# Patient Record
Sex: Female | Born: 1970 | Race: White | Hispanic: Yes | Marital: Married | Smoking: Current every day smoker
Health system: Southern US, Community
[De-identification: ages and names within clinical notes are randomized; demographics above are authoritative.]

---

## 2019-03-25 ENCOUNTER — Emergency Department
Admission: EM | Admit: 2019-03-25 | Discharge: 2019-03-25 | Disposition: A | Discharge status: Home / Self Care | Payer: Self-pay | Attending: Emergency Medicine | Admitting: Emergency Medicine

## 2019-03-25 ENCOUNTER — Other Ambulatory Visit: Payer: Self-pay

## 2019-03-25 ENCOUNTER — Encounter: Payer: Self-pay | Admitting: Emergency Medicine

## 2019-03-25 ENCOUNTER — Emergency Department: Payer: Self-pay

## 2019-03-25 DIAGNOSIS — E876 Hypokalemia: Secondary | ICD-10-CM | POA: Insufficient documentation

## 2019-03-25 DIAGNOSIS — R1013 Epigastric pain: Secondary | ICD-10-CM | POA: Insufficient documentation

## 2019-03-25 DIAGNOSIS — R0789 Other chest pain: Secondary | ICD-10-CM | POA: Insufficient documentation

## 2019-03-25 DIAGNOSIS — R112 Nausea with vomiting, unspecified: Secondary | ICD-10-CM | POA: Insufficient documentation

## 2019-03-25 DIAGNOSIS — R197 Diarrhea, unspecified: Secondary | ICD-10-CM | POA: Insufficient documentation

## 2019-03-25 LAB — CBC
HCT: 42 % (ref 36.0–46.0)
Hemoglobin: 13.2 g/dL (ref 12.0–15.0)
MCH: 24.4 pg — ABNORMAL LOW (ref 26.0–34.0)
MCHC: 31.4 g/dL (ref 30.0–36.0)
MCV: 77.6 fL — ABNORMAL LOW (ref 80.0–100.0)
Platelets: 295 10*3/uL (ref 150–400)
RBC: 5.41 MIL/uL — ABNORMAL HIGH (ref 3.87–5.11)
RDW: 16.9 % — ABNORMAL HIGH (ref 11.5–15.5)
WBC: 12.6 10*3/uL — ABNORMAL HIGH (ref 4.0–10.5)
nRBC: 0 % (ref 0.0–0.2)

## 2019-03-25 LAB — TROPONIN I (HIGH SENSITIVITY): Troponin I (High Sensitivity): <2 ng/L (ref <18)

## 2019-03-25 LAB — COMPREHENSIVE METABOLIC PANEL
ALT: 32 U/L (ref 0–44)
AST: 20 U/L (ref 15–41)
Albumin: 4.2 g/dL (ref 3.5–5.0)
Alkaline Phosphatase: 65 U/L (ref 38–126)
Anion gap: 11 (ref 5–15)
BUN: 20 mg/dL (ref 6–20)
CO2: 25 mmol/L (ref 22–32)
Calcium: 8.7 mg/dL — ABNORMAL LOW (ref 8.9–10.3)
Chloride: 97 mmol/L — ABNORMAL LOW (ref 98–111)
Creatinine, Ser: 0.62 mg/dL (ref 0.44–1.00)
GFR calc Af Amer: >60 mL/min (ref >60)
GFR calc non Af Amer: >60 mL/min (ref >60)
Glucose, Bld: 119 mg/dL — ABNORMAL HIGH (ref 70–99)
Potassium: 3 mmol/L — ABNORMAL LOW (ref 3.5–5.1)
Sodium: 133 mmol/L — ABNORMAL LOW (ref 135–145)
Total Bilirubin: 1 mg/dL (ref 0.3–1.2)
Total Protein: 7.4 g/dL (ref 6.5–8.1)

## 2019-03-25 LAB — URINALYSIS, COMPLETE (UACMP) WITH MICROSCOPIC
Bilirubin Urine: NEGATIVE
Glucose, UA: NEGATIVE mg/dL
Hgb urine dipstick: NEGATIVE
Ketones, ur: NEGATIVE mg/dL
Leukocytes,Ua: NEGATIVE
Nitrite: NEGATIVE
Protein, ur: NEGATIVE mg/dL
Specific Gravity, Urine: 1.01 (ref 1.005–1.030)
pH: 6 (ref 5.0–8.0)

## 2019-03-25 LAB — POCT PREGNANCY, URINE
Preg Test, Ur: NEGATIVE
Preg Test, Ur: NEGATIVE

## 2019-03-25 LAB — LIPASE, BLOOD: Lipase: 29 U/L (ref 11–51)

## 2019-03-25 IMAGING — CT CT ABD-PELV W/ CM
2 of 5 series · 16 of 46 positions shown, 18 images · IV contrast (APPLIED)
Comparison: None.

CLINICAL DATA: Chest and abdomen pain for 4 days. Vomiting.

EXAM:
CT ABDOMEN AND PELVIS WITH CONTRAST
TECHNIQUE: Multidetector CT imaging of the abdomen and pelvis was performed
using the standard protocol following bolus administration of
intravenous contrast.
CONTRAST:  75mL OMNIPAQUE IOHEXOL 300 MG/ML  SOLN

[Series 2: axial st · axial · 0.69mm/px · z∈[-799,-424]mm · 13 of 85 slices shown, 15 images]
[im 5/85  soft-tissue]
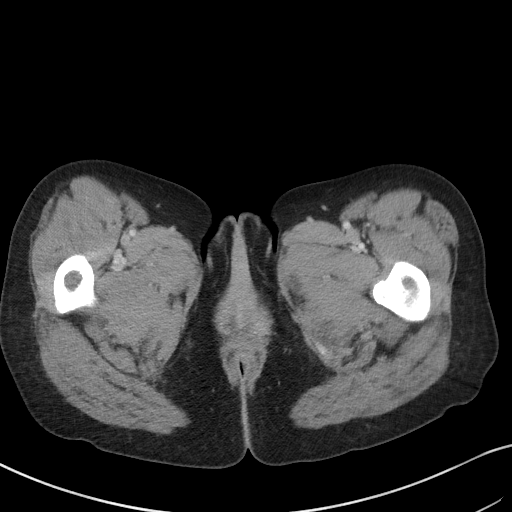
[im 5/85  bone]
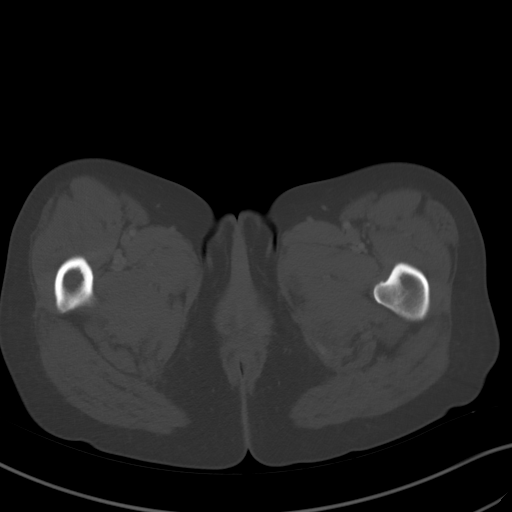
[im 14/85  soft-tissue]
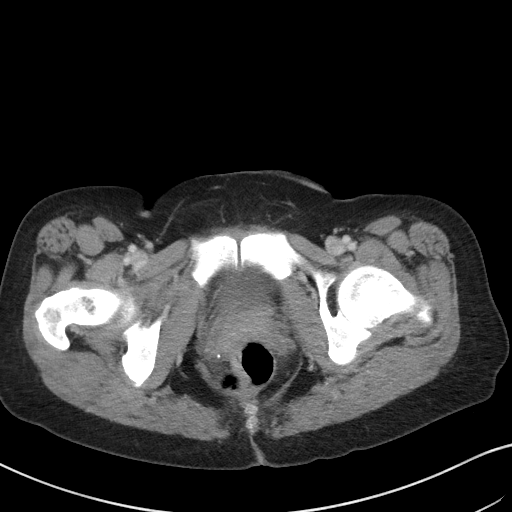
[im 18/85  soft-tissue]
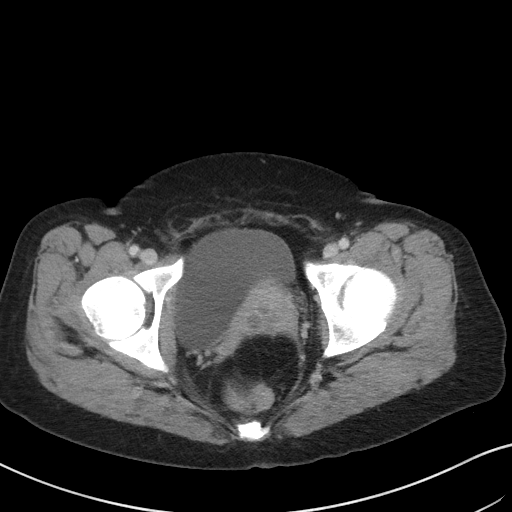
[im 23/85  soft-tissue]
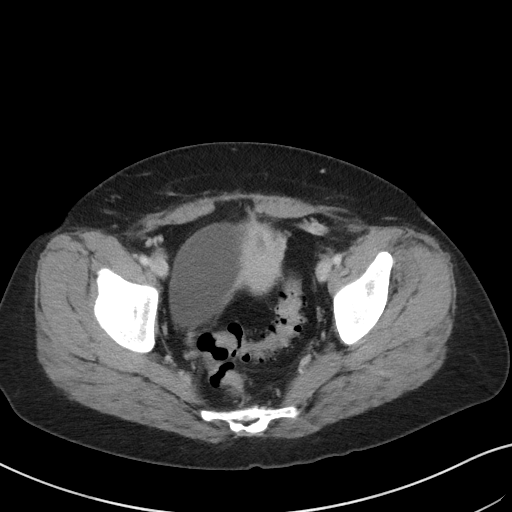
[im 31/85  soft-tissue]
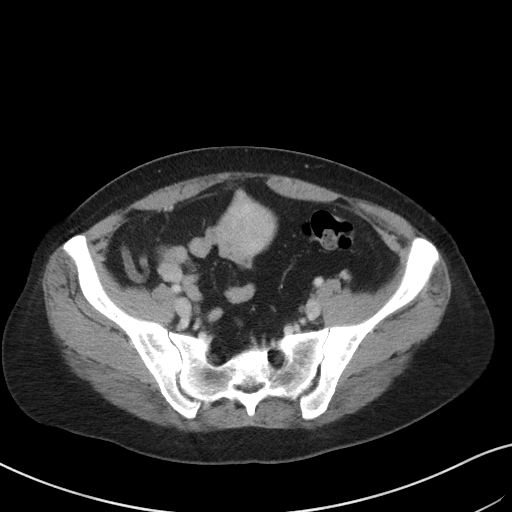
[im 36/85  soft-tissue]
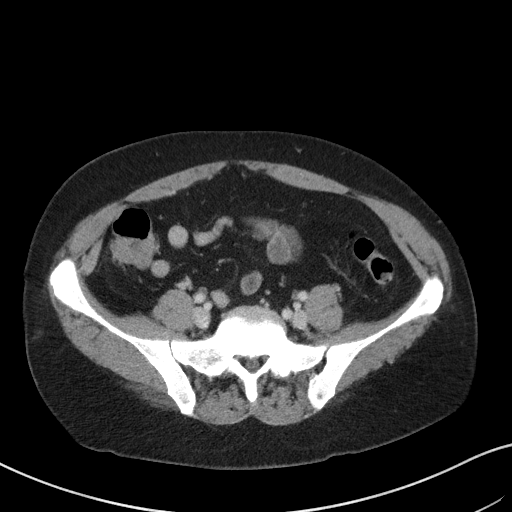
[im 45/85  soft-tissue]
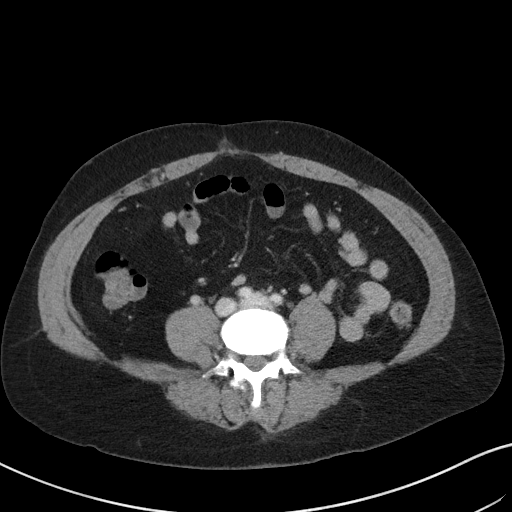
[im 49/85  soft-tissue]
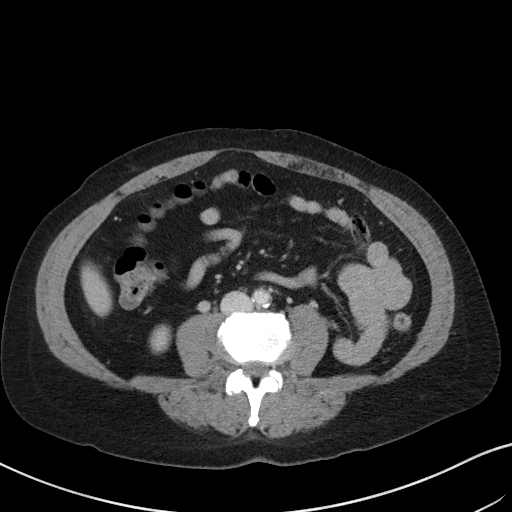
[im 54/85  soft-tissue]
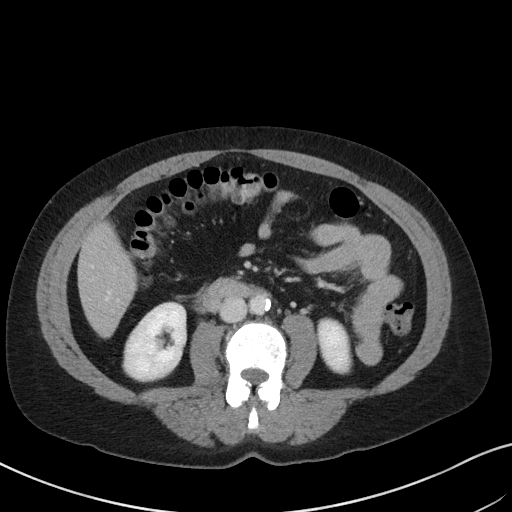
[im 54/85  bone]
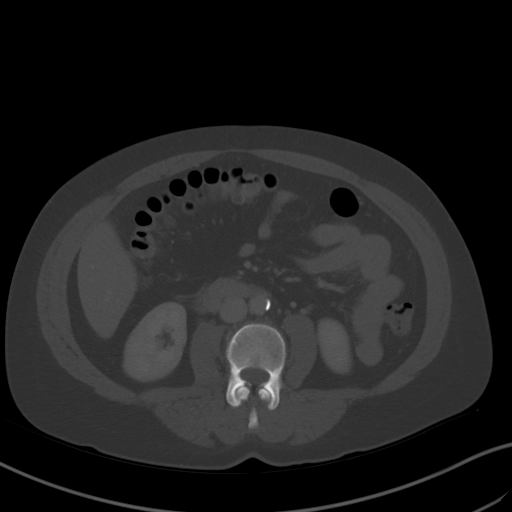
[im 62/85  soft-tissue]
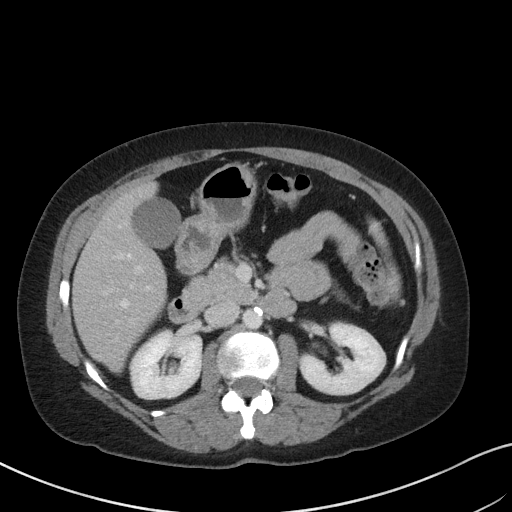
[im 67/85  soft-tissue]
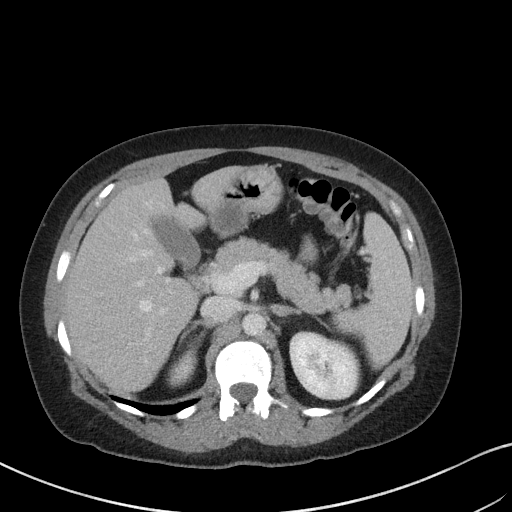
[im 71/85  soft-tissue]
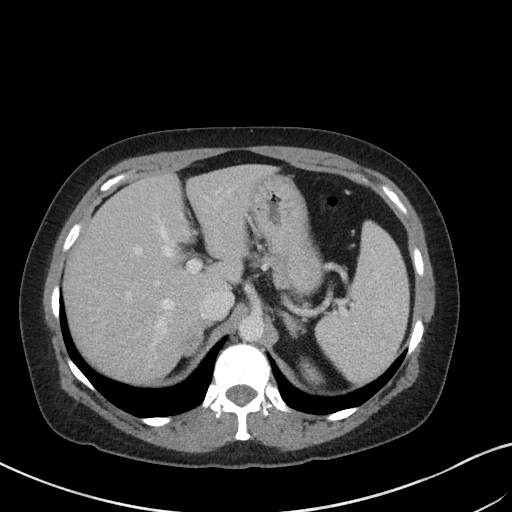
[im 80/85  soft-tissue]
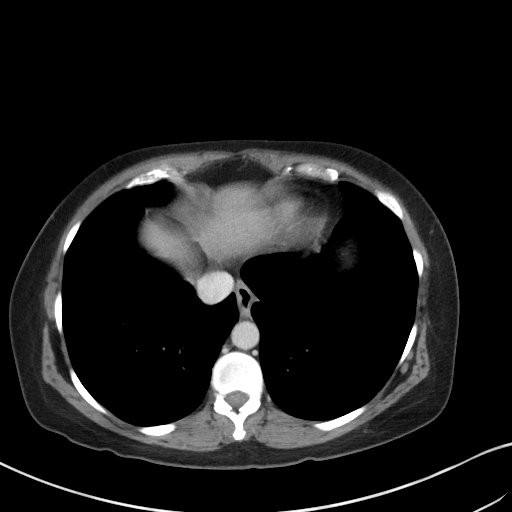

[Series 5: coronal st · coronal · 0.64mm/px · 3 of 86 slices shown]
[im 29/86  soft-tissue]
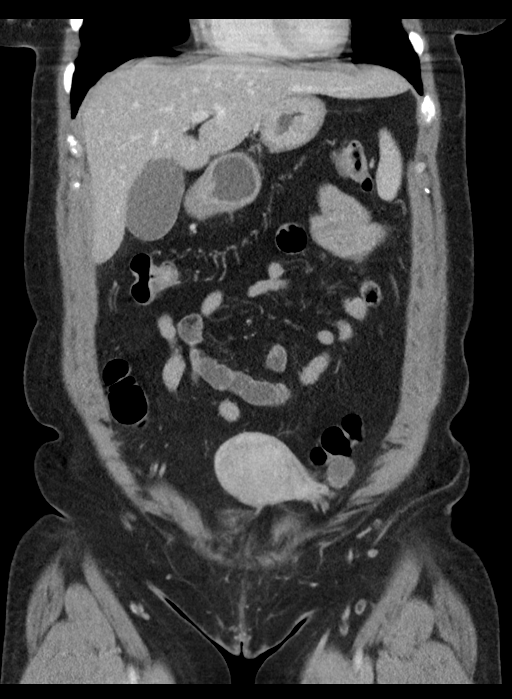
[im 38/86  soft-tissue]
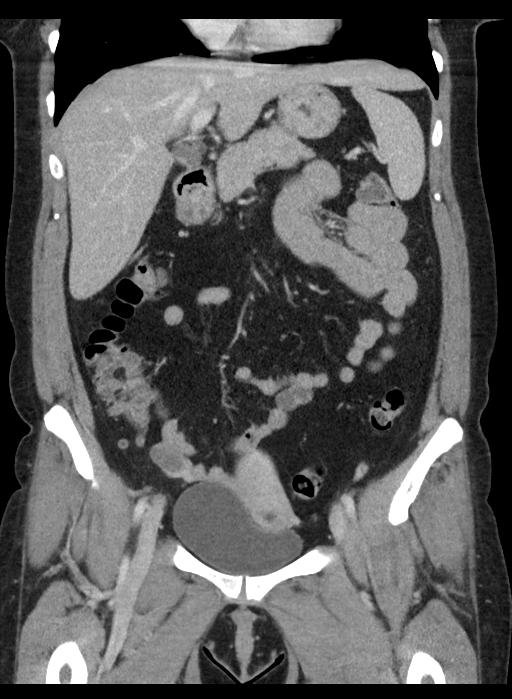
[im 48/86  soft-tissue]
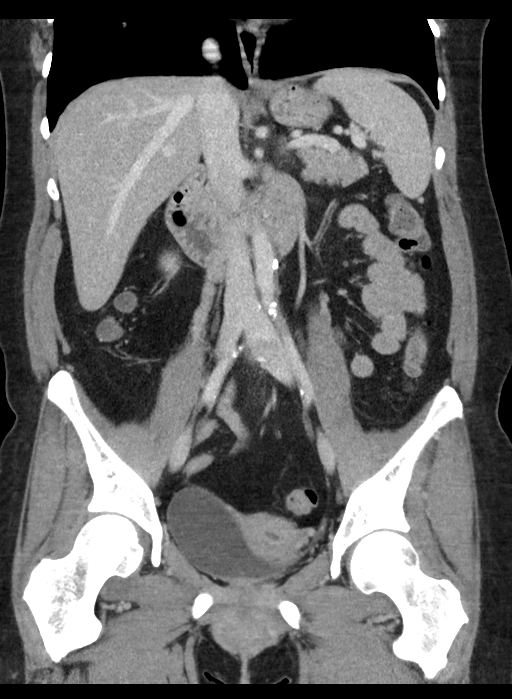

[16 of 46 positions shown; findings below may reference images not displayed]

FINDINGS: Lower chest: No acute abnormality.

Hepatobiliary: No focal liver abnormality is seen. No gallstones,
gallbladder wall thickening, or biliary dilatation.

Pancreas: Unremarkable. No pancreatic ductal dilatation or
surrounding inflammatory changes.

Spleen: Normal in size without focal abnormality.

Adrenals/Urinary Tract: There is a 2.5 cm slight low-density mass in
the right adrenal gland. The left adrenal gland is normal. The
bilateral kidneys are normal. There is no hydronephrosis
bilaterally. The bladder is normal.

Stomach/Bowel: Stomach is within normal limits. Appendix appears
normal. No evidence of bowel wall thickening, distention, or
inflammatory changes.

Vascular/Lymphatic: Aortic atherosclerosis. No enlarged abdominal or
pelvic lymph nodes.

Reproductive: Low-density lesions are identified in the uterus
probably uterine fibroids. The bilateral adnexa are normal.

Other: None.

Musculoskeletal: Minimal degenerative joint changes of spine are
identified.
IMPRESSION: 1. No acute abnormality identified in the abdomen and pelvis.
2. 2.5 cm slight low-density mass in the right adrenal gland.
Consider further evaluation with adrenal mass protocol CT on
outpatient basis.

## 2019-03-25 MED ORDER — IOHEXOL 300 MG/ML  SOLN
75.0000 mL | Freq: Once | INTRAMUSCULAR | Status: AC | PRN
  Administered 2019-03-25: 75 mL via INTRAVENOUS
  Filled 2019-03-25: qty 75

## 2019-03-25 MED ORDER — SODIUM CHLORIDE 0.9% FLUSH: 3.0000 mL | Freq: Once | INTRAVENOUS | Status: DC

## 2019-03-25 MED ORDER — MORPHINE SULFATE (PF) 4 MG/ML IV SOLN
4.0000 mg | Freq: Once | INTRAVENOUS | Status: AC
  Administered 2019-03-25: 4 mg via INTRAVENOUS
  Filled 2019-03-25: qty 1

## 2019-03-25 MED ORDER — ONDANSETRON HCL 4 MG/2ML IJ SOLN
4.0000 mg | Freq: Once | INTRAMUSCULAR | Status: AC
  Administered 2019-03-25: 4 mg via INTRAVENOUS
  Filled 2019-03-25: qty 2

## 2019-03-25 MED ORDER — ONDANSETRON 4 MG PO TBDP: 4.0000 mg | ORAL_TABLET | Freq: Three times a day (TID) | ORAL | 0 refills | Status: DC | PRN

## 2019-03-25 MED ORDER — POTASSIUM CHLORIDE CRYS ER 20 MEQ PO TBCR
40.0000 meq | EXTENDED_RELEASE_TABLET | Freq: Once | ORAL | Status: AC
  Administered 2019-03-25: 40 meq via ORAL
  Filled 2019-03-25: qty 2

## 2019-03-25 MED ORDER — LACTATED RINGERS IV BOLUS
1000.0000 mL | Freq: Once | INTRAVENOUS | Status: AC
  Administered 2019-03-25: 1000 mL via INTRAVENOUS

## 2019-03-25 NOTE — ED Provider Notes (Signed)
[facility] Emergency Department Provider Note   ____________________________________________   First MD Initiated Contact with Patient 03/25/19 1326     (approximate)  I have reviewed the triage vital signs and the nursing notes.   HISTORY  Chief Complaint Abdominal Pain and Emesis    HPI Dana Jordan is a 49 y.o. female with no significant past medical history who presents to the ED complaining of abdominal pain and vomiting.  Patient reports that she first developed nausea and vomiting 4 days ago with an inability to tolerate any p.o.  Since then, she has had onset of epigastric and central chest pain, which she describes as constant and sharp, not exacerbated or alleviated by anything.  Her symptoms have been associated with diarrhea, but she denies any dysuria or hematuria.  She has not had any fevers, cough, or shortness of breath, and she denies any sick contacts.  She denies similar episodes in the past, states her abdominal surgical history only includes cesarean sections.  She states her LMP was 3 weeks ago.        History reviewed. No pertinent past medical history.  There are no problems to display for this patient.   History reviewed. No pertinent surgical history.  Prior to Admission medications   Medication Sig Start Date End Date Taking? Authorizing Provider  ondansetron (ZOFRAN ODT) 4 MG disintegrating tablet Take 1 tablet (4 mg total) by mouth every 8 (eight) hours as needed for nausea or vomiting. 03/25/19   Blake Divine, MD    Allergies Patient has no known allergies.  No family history on file.  Social History Social History   Tobacco Use  . Smoking status: Not on file  Substance Use Topics  . Alcohol use: Not on file  . Drug use: Not on file    Review of Systems  Constitutional: No fever/chills Eyes: No visual changes. ENT: No sore throat. Cardiovascular: Positive for chest pain. Respiratory: Denies  shortness of breath. Gastrointestinal: Positive for abdominal pain.  Positive for nausea and vomiting.  Positive for diarrhea.  No constipation. Genitourinary: Negative for dysuria. Musculoskeletal: Negative for back pain. Skin: Negative for rash. Neurological: Negative for headaches, focal weakness or numbness.  ____________________________________________   PHYSICAL EXAM:  VITAL SIGNS: ED Triage Vitals [03/25/19 1130]  Enc Vitals Group     BP (!) 138/93     Pulse Rate 96     Resp 20     Temp 98.6 F (37 C)     Temp Source Oral     SpO2 99 %     Weight 125 lb (56.7 kg)     Height 4\' 11"  (1.499 m)     Head Circumference      Peak Flow      Pain Score 10     Pain Loc      Pain Edu?      Excl. in [place]?     Constitutional: Alert and oriented. Eyes: Conjunctivae are normal. Head: Atraumatic. Nose: No congestion/rhinnorhea. Mouth/Throat: Mucous membranes are moist. Neck: Normal ROM Cardiovascular: Normal rate, regular rhythm. Grossly normal heart sounds. Respiratory: Normal respiratory effort.  No retractions. Lungs CTAB.  No chest wall tenderness. Gastrointestinal: Soft and tender to palpation in the epigastrium with no rebound or guarding. No distention. Genitourinary: deferred Musculoskeletal: No lower extremity tenderness nor edema. Neurologic:  Normal speech and language. No gross focal neurologic deficits are appreciated. Skin:  Skin is warm, dry and intact. No rash noted. Psychiatric: Mood  and affect are normal. Speech and behavior are normal.  ____________________________________________   LABS (all labs ordered are listed, but only abnormal results are displayed)  Labs Reviewed  COMPREHENSIVE METABOLIC PANEL - Abnormal; Notable for the following components:      Result Value   Sodium 133 (*)    Potassium 3.0 (*)    Chloride 97 (*)    Glucose, Bld 119 (*)    Calcium 8.7 (*)    All other components within normal limits  CBC - Abnormal; Notable for the  following components:   WBC 12.6 (*)    RBC 5.41 (*)    MCV 77.6 (*)    MCH 24.4 (*)    RDW 16.9 (*)    All other components within normal limits  URINALYSIS, COMPLETE (UACMP) WITH MICROSCOPIC - Abnormal; Notable for the following components:   Color, Urine YELLOW (*)    APPearance HAZY (*)    Bacteria, UA RARE (*)    All other components within normal limits  LIPASE, BLOOD  POC URINE PREG, ED  POCT PREGNANCY, URINE  POCT PREGNANCY, URINE  TROPONIN I (HIGH SENSITIVITY)   ____________________________________________  EKG  ED ECG REPORT I, Chesley Noon, the attending physician, personally viewed and interpreted this ECG.   Date: 03/25/2019  EKG Time: 11:43  Rate: 90  Rhythm: normal sinus rhythm  Axis: Normal  Intervals:none  ST&T Change: None   PROCEDURES  Procedure(s) performed (including Critical Care):  Procedures   ____________________________________________   INITIAL IMPRESSION / ASSESSMENT AND PLAN / ED COURSE      49 year old female with no significant medical history or abdominal surgical history presents to the ED with 4 days of persistent nausea and vomiting, has since developed upper abdominal and lower chest pain.  EKG shows no acute ischemic changes and low suspicion for ACS given symptoms seem primarily abdominal in origin.  Will screen single set troponin, but doubt ACS if this is negative.  Lab work thus far is reassuring, significant only for mild hypokalemia, LFTs and lipase are within normal limits.  Given her tenderness, we will check CT scan, also awaiting urine results.   CT negative for acute process and UA unremarkable.  Troponin within normal limits, doubt ACS and gastritis with element of reflux seems more likely etiology of her symptoms along with gastroenteritis.  Will prescribe Zofran for symptom control and counseled patient to follow-up with her PCP, otherwise return to the ED for new or worsening symptoms.  Patient agrees with  plan.      ____________________________________________   FINAL CLINICAL IMPRESSION(S) / ED DIAGNOSES  Final diagnoses:  Nausea vomiting and diarrhea  Epigastric pain     ED Discharge Orders         Ordered    ondansetron (ZOFRAN ODT) 4 MG disintegrating tablet  Every 8 hours PRN     03/25/19 1537           Note:  This document was prepared using Dragon voice recognition software and may include unintentional dictation errors.   Chesley Noon, MD 03/25/19 1820

## 2019-03-25 NOTE — ED Notes (Signed)
Pt presents to ED with c/o vomiting and dry heaves since Thursday. Pt also c/o substernal CP x 2 days, states felt like "a pop". Pt states "I haven't been able to get any rest so I had to come here". Pt states "my family is still looking at the stove like where's dinner? 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 Pt states has had dry heaves primarily. Pt states pain in chest is reproduce-able with palpation and worse with movement/position changes. Pt is tearful upon arrival to room. Lights dimmed for patient comfort at this time.

## 2019-03-25 NOTE — ED Notes (Signed)
Pt alert and oriented X 4, stable for discharge. RR even and unlabored, color WNL. Discussed discharge instructions and follow up when appropriate. Instructed to follow up with ER for any life threatening symptoms or concerns that patient or family of patient may have  

## 2019-03-25 NOTE — ED Triage Notes (Signed)
Nausea, vomiting and epigastric pain x 4 days.

## 2019-03-25 NOTE — ED Notes (Signed)
POC preg negative.

## 2019-06-06 ENCOUNTER — Ambulatory Visit: Payer: Self-pay | Admitting: Gerontology

## 2019-06-06 ENCOUNTER — Encounter: Payer: Self-pay | Admitting: Gerontology

## 2019-06-06 ENCOUNTER — Other Ambulatory Visit: Payer: Self-pay

## 2019-06-06 VITALS — BP 126/84 | HR 92 | Temp 96.6°F | Wt 134.1 lb

## 2019-06-06 DIAGNOSIS — Z7689 Persons encountering health services in other specified circumstances: Secondary | ICD-10-CM | POA: Insufficient documentation

## 2019-06-06 DIAGNOSIS — F17200 Nicotine dependence, unspecified, uncomplicated: Secondary | ICD-10-CM

## 2019-06-06 DIAGNOSIS — E278 Other specified disorders of adrenal gland: Secondary | ICD-10-CM | POA: Insufficient documentation

## 2019-06-06 NOTE — Patient Instructions (Signed)
Steps to Quit Smoking Smoking tobacco is the leading cause of preventable death. It can affect almost every organ in the body. Smoking puts you and people around you at risk for many serious, long-lasting (chronic) diseases. Quitting smoking can be hard, but it is one of the best things that you can do for your health. It is never too late to quit. How do I get ready to quit? When you decide to quit smoking, make a plan to help you succeed. Before you quit:  Pick a date to quit. Set a date within the next 2 weeks to give you time to prepare.  Write down the reasons why you are quitting. Keep this list in places where you will see it often.  Tell your family, friends, and co-workers that you are quitting. Their support is important.  Talk with your doctor about the choices that may help you quit.  Find out if your health insurance will pay for these treatments.  Know the people, places, things, and activities that make you want to smoke (triggers). Avoid them. What first steps can I take to quit smoking?  Throw away all cigarettes at home, at work, and in your car.  Throw away the things that you use when you smoke, such as ashtrays and lighters.  Clean your car. Make sure to empty the ashtray.  Clean your home, including curtains and carpets. What can I do to help me quit smoking? Talk with your doctor about taking medicines and seeing a counselor at the same time. You are more likely to succeed when you do both.  If you are pregnant or breastfeeding, talk with your doctor about counseling or other ways to quit smoking. Do not take medicine to help you quit smoking unless your doctor tells you to do so. To quit smoking: Quit right away  Quit smoking totally, instead of slowly cutting back on how much you smoke over a period of time.  Go to counseling. You are more likely to quit if you go to counseling sessions regularly. Take medicine You may take medicines to help you quit. Some  medicines need a prescription, and some you can buy over-the-counter. Some medicines may contain a drug called nicotine to replace the nicotine in cigarettes. Medicines may:  Help you to stop having the desire to smoke (cravings).  Help to stop the problems that come when you stop smoking (withdrawal symptoms). Your doctor may ask you to use:  Nicotine patches, gum, or lozenges.  Nicotine inhalers or sprays.  Non-nicotine medicine that is taken by mouth. Find resources Find resources and other ways to help you quit smoking and remain smoke-free after you quit. These resources are most helpful when you use them often. They include:  Online chats with a counselor.  Phone quitlines.  Printed self-help materials.  Support groups or group counseling.  Text messaging programs.  Mobile phone apps. Use apps on your mobile phone or tablet that can help you stick to your quit plan. There are many free apps for mobile phones and tablets as well as websites. Examples include Quit Guide from the CDC and smokefree.gov  What things can I do to make it easier to quit?   Talk to your family and friends. Ask them to support and encourage you.  Call a phone quitline (1-800-QUIT-NOW), reach out to support groups, or work with a counselor.  Ask people who smoke to not smoke around you.  Avoid places that make you want to smoke,   such as: ? Bars. ? Parties. ? Smoke-break areas at work.  Spend time with people who do not smoke.  Lower the stress in your life. Stress can make you want to smoke. Try these things to help your stress: ? Getting regular exercise. ? Doing deep-breathing exercises. ? Doing yoga. ? Meditating. ? Doing a body scan. To do this, close your eyes, focus on one area of your body at a time from head to toe. Notice which parts of your body are tense. Try to relax the muscles in those areas. How will I feel when I quit smoking? Day 1 to 3 weeks Within the first 24 hours,  you may start to have some problems that come from quitting tobacco. These problems are very bad 2-3 days after you quit, but they do not often last for more than 2-3 weeks. You may get these symptoms:  Mood swings.  Feeling restless, nervous, angry, or annoyed.  Trouble concentrating.  Dizziness.  Strong desire for high-sugar foods and nicotine.  Weight gain.  Trouble pooping (constipation).  Feeling like you may vomit (nausea).  Coughing or a sore throat.  Changes in how the medicines that you take for other issues work in your body.  Depression.  Trouble sleeping (insomnia). Week 3 and afterward After the first 2-3 weeks of quitting, you may start to notice more positive results, such as:  Better sense of smell and taste.  Less coughing and sore throat.  Slower heart rate.  Lower blood pressure.  Clearer skin.  Better breathing.  Fewer sick days. Quitting smoking can be hard. Do not give up if you fail the first time. Some people need to try a few times before they succeed. Do your best to stick to your quit plan, and talk with your doctor if you have any questions or concerns. Summary  Smoking tobacco is the leading cause of preventable death. Quitting smoking can be hard, but it is one of the best things that you can do for your health.  When you decide to quit smoking, make a plan to help you succeed.  Quit smoking right away, not slowly over a period of time.  When you start quitting, seek help from your doctor, family, or friends. This information is not intended to replace advice given to you by your health care provider. Make sure you discuss any questions you have with your health care provider. Document Revised: 11/16/2018 Document Reviewed: 05/12/2018 Elsevier Patient Education  2020 [employer]  

## 2019-06-06 NOTE — Progress Notes (Signed)
Patient ID: Dana Jordan, female   DOB: 06-Sep-1970, 49 y.o.   MRN: [id number]  No chief complaint on file.   HPI Dana Jordan is a 49 y.o. female who presents to establish care and evaluation of her chronic conditions. She was seen at the ED on 03/25/2019 for abdominal pain and Emesis and was treated with zofran. Abdomen/Pelvis CT scan was done and it showed 2.5 cm slight low-density mass in the right adrenal gland and further evaluation with adrenal mass protocol was suggested. She smokes 1/2 pack of cigarette daily and admits the desire to quit. She states that she's in good health, denies chest pain, palpitation, light headedness, fever, chills and she offers no further complaint.  No past medical history on file.  No past surgical history on file.  No family history on file.  Social History Social History   Tobacco Use  . Smoking status: Current Every Day Smoker    Packs/day: 1.50    Years: 20.00    Pack years: 30.00    Types: Cigarettes  . Tobacco comment: Has tried quitting. Due to stress.  Substance Use Topics  . Alcohol use: Never  . Drug use: Not on file    No Known Allergies  No current outpatient medications on file.   No current facility-administered medications for this visit.    Review of Systems Review of Systems  Constitutional: Negative.   HENT: Negative.   Eyes: Negative.   Respiratory: Negative.   Cardiovascular: Negative.   Gastrointestinal: Negative.   Endocrine: Negative.   Genitourinary: Negative.   Musculoskeletal: Negative.   Skin: Negative.   Allergic/Immunologic: Negative.   Neurological: Negative.   Hematological: Negative.   Psychiatric/Behavioral: Negative.     Blood pressure 126/84, pulse 92, temperature (!) 96.6 F (35.9 C), weight 134 lb 1.6 oz (60.8 kg), SpO2 98 %.  Physical Exam Physical Exam Constitutional:      Appearance: Normal appearance.  HENT:     Head: Normocephalic and atraumatic.     Nose:   Comments: Deferred per Covid protocol    Mouth/Throat:     Comments: Deferred per Covid protocol Eyes:     Extraocular Movements: Extraocular movements intact.     Pupils: Pupils are equal, round, and reactive to light.  Cardiovascular:     Rate and Rhythm: Normal rate and regular rhythm.     Pulses: Normal pulses.     Heart sounds: Normal heart sounds.  Pulmonary:     Effort: Pulmonary effort is normal.     Breath sounds: Normal breath sounds.  Abdominal:     General: Abdomen is flat. Bowel sounds are normal.     Palpations: Abdomen is soft.  Genitourinary:    Comments: Deferred per patient. Musculoskeletal:        General: Normal range of motion.     Cervical back: Normal range of motion.  Skin:    General: Skin is warm and dry.  Neurological:     General: No focal deficit present.     Mental Status: She is alert and oriented to person, place, and time. Mental status is at baseline.  Psychiatric:        Mood and Affect: Mood normal.        Behavior: Behavior normal.        Thought Content: Thought content normal.        Judgment: Judgment normal.     Data Reviewed Lab and past medical history was reviewed.  Assessment and Plan  1. Encounter to establish care - Routine lab will be checked. - CBC w/Diff; Future - Comp Met (CMET); Future - Lipid panel; Future - HgB A1c; Future - TSH; Future - Urinalysis; Future  2. Mass of right adrenal gland [facility]) - She was advised to complete [facility] charity care application for - Ambulatory referral to Endocrinology  3. Smoking - She was advised on smoking cessation and was provided with [place] Quit line information.   Follow up: 07/09/2019 or if symptom worsens or fail to improve.  Chioma E I[place]abachie 06/07/2019, 3:01 PM

## 2019-06-12 ENCOUNTER — Other Ambulatory Visit: Payer: Self-pay

## 2019-06-12 DIAGNOSIS — Z7689 Persons encountering health services in other specified circumstances: Secondary | ICD-10-CM

## 2019-06-13 LAB — URINALYSIS
Bilirubin, UA: NEGATIVE
Glucose, UA: NEGATIVE
Ketones, UA: NEGATIVE
Leukocytes,UA: NEGATIVE
Nitrite, UA: NEGATIVE
Protein,UA: NEGATIVE
RBC, UA: NEGATIVE
Specific Gravity, UA: 1.008 (ref 1.005–1.030)
Urobilinogen, Ur: 0.2 mg/dL (ref 0.2–1.0)
pH, UA: 7 (ref 5.0–7.5)

## 2019-06-13 LAB — LIPID PANEL
Chol/HDL Ratio: 5 ratio — ABNORMAL HIGH (ref 0.0–4.4)
Cholesterol, Total: 196 mg/dL (ref 100–199)
HDL: 39 mg/dL — ABNORMAL LOW (ref >39)
LDL Chol Calc (NIH): 105 mg/dL — ABNORMAL HIGH (ref 0–99)
Triglycerides: 307 mg/dL — ABNORMAL HIGH (ref 0–149)
VLDL Cholesterol Cal: 52 mg/dL — ABNORMAL HIGH (ref 5–40)

## 2019-06-13 LAB — CBC WITH DIFFERENTIAL/PLATELET
Basophils Absolute: 0 10*3/uL (ref 0.0–0.2)
Basos: 1 %
EOS (ABSOLUTE): 0.2 10*3/uL (ref 0.0–0.4)
Eos: 2 %
Hematocrit: 38.5 % (ref 34.0–46.6)
Hemoglobin: 12.3 g/dL (ref 11.1–15.9)
Immature Grans (Abs): 0.1 10*3/uL (ref 0.0–0.1)
Immature Granulocytes: 1 %
Lymphocytes Absolute: 2 10*3/uL (ref 0.7–3.1)
Lymphs: 24 %
MCH: 24.9 pg — ABNORMAL LOW (ref 26.6–33.0)
MCHC: 31.9 g/dL (ref 31.5–35.7)
MCV: 78 fL — ABNORMAL LOW (ref 79–97)
Monocytes Absolute: 0.7 10*3/uL (ref 0.1–0.9)
Monocytes: 8 %
Neutrophils Absolute: 5.3 10*3/uL (ref 1.4–7.0)
Neutrophils: 64 %
Platelets: 260 10*3/uL (ref 150–450)
RBC: 4.93 x10E6/uL (ref 3.77–5.28)
RDW: 16.4 % — ABNORMAL HIGH (ref 11.7–15.4)
WBC: 8.2 10*3/uL (ref 3.4–10.8)

## 2019-06-13 LAB — COMPREHENSIVE METABOLIC PANEL
ALT: 9 IU/L (ref 0–32)
AST: 12 IU/L (ref 0–40)
Albumin/Globulin Ratio: 1.9 (ref 1.2–2.2)
Albumin: 4.4 g/dL (ref 3.8–4.8)
Alkaline Phosphatase: 96 IU/L (ref 39–117)
BUN/Creatinine Ratio: 11 (ref 9–23)
BUN: 8 mg/dL (ref 6–24)
Bilirubin Total: 0.2 mg/dL (ref 0.0–1.2)
CO2: 25 mmol/L (ref 20–29)
Calcium: 9.5 mg/dL (ref 8.7–10.2)
Chloride: 102 mmol/L (ref 96–106)
Creatinine, Ser: 0.73 mg/dL (ref 0.57–1.00)
GFR calc Af Amer: 113 mL/min/{1.73_m2} (ref >59)
GFR calc non Af Amer: 98 mL/min/{1.73_m2} (ref >59)
Globulin, Total: 2.3 g/dL (ref 1.5–4.5)
Glucose: 97 mg/dL (ref 65–99)
Potassium: 4.4 mmol/L (ref 3.5–5.2)
Sodium: 138 mmol/L (ref 134–144)
Total Protein: 6.7 g/dL (ref 6.0–8.5)

## 2019-06-13 LAB — HEMOGLOBIN A1C
Est. average glucose Bld gHb Est-mCnc: 108 mg/dL
Hgb A1c MFr Bld: 5.4 % (ref 4.8–5.6)

## 2019-06-13 LAB — TSH: TSH: 0.866 u[IU]/mL (ref 0.450–4.500)

## 2019-07-09 ENCOUNTER — Ambulatory Visit: Payer: Self-pay | Admitting: "Endocrinology

## 2019-07-09 ENCOUNTER — Ambulatory Visit: Payer: Self-pay | Admitting: Gerontology

## 2019-09-30 ENCOUNTER — Ambulatory Visit: Payer: Self-pay | Admitting: "Endocrinology
